# Patient Record
Sex: Male | Born: 2000 | Race: White | Hispanic: Yes | Marital: Single | Smoking: Never smoker
Health system: Southern US, Community
[De-identification: ages and names within clinical notes are randomized; demographics above are authoritative.]

## PROBLEM LIST (undated history)

## (undated) DIAGNOSIS — L309 Dermatitis, unspecified: Secondary | ICD-10-CM

---
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
## 2022-08-09 ENCOUNTER — Encounter (HOSPITAL_BASED_OUTPATIENT_CLINIC_OR_DEPARTMENT_OTHER): Payer: Self-pay

## 2022-08-09 ENCOUNTER — Emergency Department (HOSPITAL_BASED_OUTPATIENT_CLINIC_OR_DEPARTMENT_OTHER): Payer: 59

## 2022-08-09 ENCOUNTER — Other Ambulatory Visit: Payer: Self-pay

## 2022-08-09 ENCOUNTER — Emergency Department (HOSPITAL_BASED_OUTPATIENT_CLINIC_OR_DEPARTMENT_OTHER)
Admission: EM | Admit: 2022-08-09 | Discharge: 2022-08-09 | Disposition: A | Discharge status: Home / Self Care | Payer: 59 | Attending: Emergency Medicine | Admitting: Emergency Medicine

## 2022-08-09 DIAGNOSIS — R0609 Other forms of dyspnea: Secondary | ICD-10-CM | POA: Insufficient documentation

## 2022-08-09 DIAGNOSIS — R55 Syncope and collapse: Secondary | ICD-10-CM | POA: Insufficient documentation

## 2022-08-09 LAB — CBC WITH DIFFERENTIAL/PLATELET
Abs Immature Granulocytes: 0.02 10*3/uL (ref 0.00–0.07)
Basophils Absolute: 0 10*3/uL (ref 0.0–0.1)
Basophils Relative: 0 %
Eosinophils Absolute: 0.1 10*3/uL (ref 0.0–0.5)
Eosinophils Relative: 1 %
HCT: 46.1 % (ref 39.0–52.0)
Hemoglobin: 16.2 g/dL (ref 13.0–17.0)
Immature Granulocytes: 0 %
Lymphocytes Relative: 18 %
Lymphs Abs: 1.5 10*3/uL (ref 0.7–4.0)
MCH: 30.7 pg (ref 26.0–34.0)
MCHC: 35.1 g/dL (ref 30.0–36.0)
MCV: 87.5 fL (ref 80.0–100.0)
Monocytes Absolute: 0.3 10*3/uL (ref 0.1–1.0)
Monocytes Relative: 4 %
Neutro Abs: 6 10*3/uL (ref 1.7–7.7)
Neutrophils Relative %: 77 %
Platelets: 273 10*3/uL (ref 150–400)
RBC: 5.27 MIL/uL (ref 4.22–5.81)
RDW: 12.6 % (ref 11.5–15.5)
WBC: 7.9 10*3/uL (ref 4.0–10.5)
nRBC: 0 % (ref 0.0–0.2)

## 2022-08-09 LAB — BASIC METABOLIC PANEL
Anion gap: 9 (ref 5–15)
BUN: 13 mg/dL (ref 6–20)
CO2: 26 mmol/L (ref 22–32)
Calcium: 10.1 mg/dL (ref 8.9–10.3)
Chloride: 104 mmol/L (ref 98–111)
Creatinine, Ser: 0.73 mg/dL (ref 0.61–1.24)
GFR, Estimated: >60 mL/min (ref >60)
Glucose, Bld: 105 mg/dL — ABNORMAL HIGH (ref 70–99)
Potassium: 4 mmol/L (ref 3.5–5.1)
Sodium: 139 mmol/L (ref 135–145)

## 2022-08-09 NOTE — ED Triage Notes (Signed)
He c/o vague feeling of shortness of breath at times for "about a year". 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 He is in no distress.

## 2022-08-09 NOTE — ED Provider Notes (Signed)
[facility] AT [facility] Provider Note   CSN: [id number] Arrival date & time: 08/09/22  4098     History  No chief complaint on file.   Daniel Hendricks is a 22 y.o. male who presents to ED complaining of dyspnea on exertion x1year and syncopal episode this morning. Patient states that he used to work in [employer] and worked in a lot of "dusty" places with mold - and has been having DOE since working that job.   Patient also with syncopal episode this morning when he got out of bed and went to take shower. Patient denies any palpitations or other symptoms before syncopal episode - endorses vision loss, loss of consciousness, and hitting his head on the floor. Patient stating that he stood up fast when getting out of bed.  Denies seizures, urinary incontinence, vomiting, headache, vision changes, blood thinners, recent surgeries, past history of blood clots. Denies fever, cough, chest pain, abdominal pain, leg swelling/edema.  HPI     Home Medications Prior to Admission medications   Medication Sig Start Date End Date Taking? Authorizing Provider  Dextromethorphan HBr (COUGH RELIEF PO) Take 5 mLs by mouth once.    [provider]  ibuprofen (ADVIL,MOTRIN) 100 MG/5ML suspension Take 20 mLs (400 mg total) by mouth every 6 (six) hours as needed for fever. 08/19/12   Marcellina Millin, MD  Ibuprofen (MOTRIN PO) Take 10 mLs by mouth every 6 (six) hours as needed (pain).    [provider]  Naproxen Sodium (ALEVE PO) Take 1 capsule by mouth 2 (two) times daily as needed (pain).    [provider]  tobramycin (TOBREX) 0.3 % ophthalmic solution Place 2 drops into the left eye every 4 (four) hours. X 7 days 03/16/14   Lowanda Foster, NP      Allergies    Patient has no known allergies.    Review of Systems   Review of Systems  Respiratory:  Positive for shortness of breath.   Neurological:  Positive for syncope.    Physical Exam Updated  Vital Signs BP 125/77 (BP Location: Right Arm)   Pulse 76   Temp 98.2 F (36.8 C)   Resp 16   SpO2 100%  Physical Exam Vitals and nursing note reviewed.  Constitutional:      General: He is not in acute distress.    Appearance: He is not ill-appearing or toxic-appearing.  HENT:     Head: Normocephalic and atraumatic.     Mouth/Throat:     Mouth: Mucous membranes are moist.     Pharynx: No oropharyngeal exudate or posterior oropharyngeal erythema.  Eyes:     General: No scleral icterus.       Right eye: No discharge.        Left eye: No discharge.     Conjunctiva/sclera: Conjunctivae normal.  Neck:     Comments: Active ROM normal without tenderness or pain Cardiovascular:     Rate and Rhythm: Normal rate and regular rhythm.     Pulses: Normal pulses.     Heart sounds: Normal heart sounds. No murmur heard. Pulmonary:     Effort: Pulmonary effort is normal. No respiratory distress.     Breath sounds: Normal breath sounds. No wheezing, rhonchi or rales.  Abdominal:     General: Abdomen is flat. Bowel sounds are normal.     Palpations: Abdomen is soft. There is no mass.     Tenderness: There is no abdominal tenderness.  Musculoskeletal:  Right lower leg: No edema.     Left lower leg: No edema.     Comments: +2 pedal and radial pulses. No edema. No calf tenderness or swelling.  Skin:    General: Skin is warm and dry.     Findings: No rash.  Neurological:     General: No focal deficit present.     Mental Status: He is alert. Mental status is at baseline.     Comments: GCS 15. Speech is goal oriented. No deficits appreciated to CN III-XII; symmetric eyebrow raise, no facial drooping, tongue midline. Patient has equal grip strength bilaterally with 5/5 strength against resistance in all major muscle groups bilaterally. Sensation to light touch intact. Patient moves extremities without ataxia. Patient ambulatory with steady gait.   Psychiatric:        Mood and Affect: Mood  normal.     ED Results / Procedures / Treatments   Labs (all labs ordered are listed, but only abnormal results are displayed) Labs Reviewed  CBC WITH DIFFERENTIAL/PLATELET  BASIC METABOLIC PANEL    EKG EKG Interpretation  Date/Time:  Sunday Aug 09 2022 09:10:43 EDT Ventricular Rate:  79 PR Interval:  152 QRS Duration: 90 QT Interval:  356 QTC Calculation: 408 R Axis:   72 Text Interpretation: Normal sinus rhythm Nonspecific ST abnormality Abnormal ECG No previous ECGs available Confirmed by Ernie Avena ([id number]) on 08/09/2022 10:34:08 AM  Radiology DG Chest Port 1 View  Result Date: 08/09/2022 CLINICAL DATA:  Shortness of breath EXAM: PORTABLE CHEST 1 VIEW COMPARISON:  10/03/2004 FINDINGS: The heart size and mediastinal contours are within normal limits. No focal airspace consolidation, pleural effusion, or pneumothorax. The visualized skeletal structures are unremarkable. IMPRESSION: No active disease. Electronically Signed   By: Duanne Guess D.O.   On: 08/09/2022 10:28    Procedures Procedures    Medications Ordered in ED Medications - No data to display  ED Course/ Medical Decision Making/ A&P                             Medical Decision Making Amount and/or Complexity of Data Reviewed Labs: ordered. Radiology: ordered.   This patient presents to the ED for concern of shortness of breath, this involves an extensive number of treatment options, and is a complaint that carries with it a high risk of complications and morbidity.  The differential diagnosis includes Anxiety, Anaphylaxis/Angioedema, Aspirated FB, Arrhythmia, CHF, Asthma, COPD, PNA, COVID/Flu/RSV, STEMI, Tamponade, TPNX, DKA, Sepsis, Toxin   Co morbidities that complicate the patient evaluation  none    Lab Tests:  I Ordered, and personally interpreted labs.  The pertinent results include:   - BMP: no concern for electrolyte abnormality; no concern for kidney damage - CBC: No concern for  anemia or leukocytosis     Imaging Studies ordered:  I ordered imaging studies including  -chest xray: no active disease I independently visualized and interpreted imaging I agree with the radiologist interpretation   Cardiac Monitoring: / EKG:  The patient was maintained on a cardiac monitor.  I personally viewed and interpreted the cardiac monitored which showed an underlying rhythm of: normal sinus rhythm without acute ST changes or arrhythmias    Problem List / ED Course / Critical interventions / Medication management  Patient presenting to ED complaining of dyspnea on exertion x1 year. Physical exam not concerning for CHF. PERC score 0 and no physical exam signs of DVT - less concerning  for PE. 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 Xray without concern. Patient also mentioning syncopal episode this morning when getting out of bed to take shower. Denies prodrome symptoms. Patient hit his head when he lost consciousness. [place] Head CT score 0, so I did not obtain imaging at this time. Patient physical exam unremarkable. Neuro exam unremarkable.  CBC and CMP reassuring. Orthostatic vitals reassurring. Patient afebrile with stable vitals. Shared imaging and lab results with patient. Recommended patient follow up with PCP as DOE could be from congenital heart defect that might require future monitoring. Patient verbally agreed to plan. I have provided patient information for community clinic. I have reviewed the patients home medicines and have made adjustments as needed Patient was given return precautions. Patient stable for discharge at this time. Patient verbalized understanding of plan.  DDx: These are considered less likely due to history of present illness and physical exam findings Aspirated FB: no history of choking Arrhythmia/STEMI: EKG without concern Asthma/COPD/PNA/COVID/Flu/RSV: Lungs clear to auscultation bilaterally and O2  sat 100% CHF: no physical exam findings TPNX: Lungs clear to auscultation bilaterally Sepsis: afebrile and other vital signs stable  Risk Stratification Score:  PERC: 0 points - low risk Canadian head CT: 0 points - low risk   Social Determinants of Health:  none            Final Clinical Impression(s) / ED Diagnoses Final diagnoses:  Vasovagal syncope    Rx / DC Orders ED Discharge Orders     None         Dorthy Cooler, [place] 08/09/22 1249    Ernie Avena, MD 08/09/22 1940

## 2022-08-09 NOTE — Discharge Instructions (Signed)
It was a pleasure caring for you today. Chest xray without concern. Labs were reassuring. I recommend following up with community clinic listed in this discharge paperwork to establish primary care.  Seek emergency care if experiencing new or worsening symtoms.
